# Patient Record
Sex: Male | Born: 1965 | Race: White | Hispanic: No | Marital: Single | Smoking: Former smoker
Health system: Southern US, Community
[De-identification: ages and names within clinical notes are randomized; demographics above are authoritative.]

## PROBLEM LIST (undated history)

## (undated) DIAGNOSIS — N419 Inflammatory disease of prostate, unspecified: Secondary | ICD-10-CM

## (undated) HISTORY — PX: TONSILLECTOMY: SUR1361

## (undated) HISTORY — PX: ABDOMINAL SURGERY: SHX537

---

## 2017-01-04 ENCOUNTER — Emergency Department (HOSPITAL_BASED_OUTPATIENT_CLINIC_OR_DEPARTMENT_OTHER)
Admission: EM | Admit: 2017-01-04 | Discharge: 2017-01-04 | Disposition: A | Discharge status: Home / Self Care | Payer: Managed Care, Other (non HMO) | Attending: Emergency Medicine | Admitting: Emergency Medicine

## 2017-01-04 ENCOUNTER — Encounter (HOSPITAL_BASED_OUTPATIENT_CLINIC_OR_DEPARTMENT_OTHER): Payer: Self-pay

## 2017-01-04 ENCOUNTER — Emergency Department (HOSPITAL_BASED_OUTPATIENT_CLINIC_OR_DEPARTMENT_OTHER): Payer: Managed Care, Other (non HMO)

## 2017-01-04 DIAGNOSIS — N411 Chronic prostatitis: Secondary | ICD-10-CM | POA: Diagnosis not present

## 2017-01-04 DIAGNOSIS — Z87891 Personal history of nicotine dependence: Secondary | ICD-10-CM | POA: Insufficient documentation

## 2017-01-04 DIAGNOSIS — R339 Retention of urine, unspecified: Secondary | ICD-10-CM | POA: Diagnosis present

## 2017-01-04 DIAGNOSIS — K5900 Constipation, unspecified: Secondary | ICD-10-CM | POA: Insufficient documentation

## 2017-01-04 HISTORY — DX: Inflammatory disease of prostate, unspecified: N41.9

## 2017-01-04 LAB — URINALYSIS, MICROSCOPIC (REFLEX)

## 2017-01-04 LAB — URINALYSIS, ROUTINE W REFLEX MICROSCOPIC
BILIRUBIN URINE: NEGATIVE
GLUCOSE, UA: NEGATIVE mg/dL
Ketones, ur: NEGATIVE mg/dL
Leukocytes, UA: NEGATIVE
Nitrite: NEGATIVE
Protein, ur: NEGATIVE mg/dL
SPECIFIC GRAVITY, URINE: 1.01 (ref 1.005–1.030)
pH: 6 (ref 5.0–8.0)

## 2017-01-04 MED ORDER — POLYETHYLENE GLYCOL 3350 17 G PO PACK: 17.0000 g | PACK | Freq: Every day | ORAL | 0 refills | Status: AC

## 2017-01-04 MED ORDER — SENNOSIDES-DOCUSATE SODIUM 8.6-50 MG PO TABS: 1.0000 | ORAL_TABLET | Freq: Every day | ORAL | 0 refills | Status: AC

## 2017-01-04 MED ORDER — FLEET ENEMA 7-19 GM/118ML RE ENEM: 1.0000 | ENEMA | Freq: Every day | RECTAL | 0 refills | Status: AC | PRN

## 2017-01-04 NOTE — Discharge Instructions (Signed)
You were seen in the ED today with urine retention and constipation. We are going to keep the foley catheter in place. This will need to be removed by either your PCP or the Urologist in the coming week. Call to schedule an appointment. Take the medication for constipation to help relieve symptoms.

## 2017-01-04 NOTE — ED Notes (Signed)
Patient stated that he had this episode 10 years ago and was diagnosed with prostitis.   Foley cath was inserted and he felt better during that time.  He also stated that he felt like he is constipated but unable to defecate.Kurt Finley

## 2017-01-04 NOTE — ED Provider Notes (Signed)
Emergency Department Provider Note   I have reviewed the triage vital signs and the nursing notes.   HISTORY  Chief Complaint Urinary Retention   HPI Kurt Finley is a 51 y.o. male with PMH of prostatitis presents to the emergency department for evaluation of sudden onset urinary hesitancy and constipation. The patient had prior similar episode 10 years ago when he was diagnosed with prostatitis. He does note some pain when trying to defecate. Denies noticing any blood in the stool or masses noted rectum. No dysuria, has since, urgency. No fevers or chills. No associated vomiting but he does have nausea when extreme pain. No urethral discharge.    Past Medical History:  Diagnosis Date  . Prostatitis     There are no active problems to display for this patient.   Past Surgical History:  Procedure Laterality Date  . ABDOMINAL SURGERY    . TONSILLECTOMY        Allergies Soy allergy  No family history on file.  Social History Social History  Substance Use Topics  . Smoking status: Former Games developer  . Smokeless tobacco: Never Used  . Alcohol use No    Review of Systems  Constitutional: No fever/chills Eyes: No visual changes. ENT: No sore throat. Cardiovascular: Denies chest pain. Respiratory: Denies shortness of breath. Gastrointestinal: No abdominal pain.  No nausea, no vomiting.  No diarrhea.  Positive constipation. Genitourinary: Negative for dysuria. Urine retention.  Musculoskeletal: Negative for back pain. Skin: Negative for rash. Neurological: Negative for headaches, focal weakness or numbness.  10-point ROS otherwise negative.  ____________________________________________   PHYSICAL EXAM:  VITAL SIGNS: ED Triage Vitals  Enc Vitals Group     BP 01/04/17 1400 (!) 133/105     Pulse Rate 01/04/17 1400 (!) 114     Resp 01/04/17 1400 (!) 22     Temp 01/04/17 1400 98.2 F (36.8 C)     Temp Source 01/04/17 1400 Oral     SpO2 01/04/17 1400 100 %     Weight 01/04/17 1401 239 lb (108.4 kg)     Height 01/04/17 1401  (1.88 m)     Pain Score 01/04/17 1359 2   Constitutional: Alert and oriented. Well appearing and in no acute distress. Eyes: Conjunctivae are normal.  Head: Atraumatic. Nose: No congestion/rhinnorhea. Mouth/Throat: Mucous membranes are moist.   Neck: No stridor.   Cardiovascular: Normal rate, regular rhythm. Good peripheral circulation. Grossly normal heart sounds.   Respiratory: Normal respiratory effort.  No retractions. Lungs CTAB. Gastrointestinal: Soft and nontender. No distention. No fissures on exam. No perirectal abscess on exam. No fecal impaction. Brown stool. No BRB or melena.  Musculoskeletal: No lower extremity tenderness nor edema. No gross deformities of extremities. Neurologic:  Normal speech and language. No gross focal neurologic deficits are appreciated.  Skin:  Skin is warm, dry and intact. No rash noted.   ____________________________________________   LABS (all labs ordered are listed, but only abnormal results are displayed)  Labs Reviewed  URINALYSIS, ROUTINE W REFLEX MICROSCOPIC - Abnormal; Notable for the following:       Result Value   Hgb urine dipstick TRACE (*)    All other components within normal limits  URINALYSIS, MICROSCOPIC (REFLEX) - Abnormal; Notable for the following:    Bacteria, UA RARE (*)    Squamous Epithelial / LPF 0-5 (*)    Non Squamous Epithelial PRESENT (*)    All other components within normal limits  URINE CULTURE   ____________________________________________  RADIOLOGY  Dg Abdomen Acute W/chest  Result Date: 01/04/2017 CLINICAL DATA:  Urge to urinate and defecate but unable to do so since early this am. EXAM: DG ABDOMEN ACUTE W/ 1V CHEST COMPARISON:  None. FINDINGS: There is no evidence of dilated bowel loops or free intraperitoneal air. No radiopaque calculi or other significant radiographic abnormality is seen. Heart size and mediastinal contours are  within normal limits. Both lungs are clear. IMPRESSION: Negative abdominal radiographs.  No acute cardiopulmonary disease. Electronically Signed   By: Elige Ko   On: 01/04/2017 15:38   Ct Renal Stone Study  Result Date: 01/04/2017 CLINICAL DATA:  Anal pain EXAM: CT ABDOMEN AND PELVIS WITHOUT CONTRAST TECHNIQUE: Multidetector CT imaging of the abdomen and pelvis was performed following the standard protocol without IV contrast. COMPARISON:  None. FINDINGS: Lower chest: No acute abnormality. Hepatobiliary: The liver and gallbladder are unremarkable. Pancreas: Unremarkable. Spleen: Unremarkable. Adrenals/Urinary Tract: Adrenal glands are within normal limits. Kidneys are within normal limits. Specifically, there is no hydronephrosis or asymmetrical perinephric stranding. No urinary calculus is identified. Foley catheter decompresses the bladder. Stomach/Bowel: Normal appendix. No obvious colonic mass. No evidence of small-bowel obstruction. Prominent stool burden in the rectum. There is stranding about the rectum in the presacral fat. There is also haziness within the central mesenteric fat. Vascular/Lymphatic: No abnormal retroperitoneal adenopathy. No evidence of aortic aneurysm. Reproductive: Prostate is normal in size. Other: No free-fluid. Musculoskeletal: No vertebral compression deformity. IMPRESSION: No evidence of urinary calculus or ureteral obstruction. Prominent stool burden in the rectum Prostate is normal in size. Foley catheter decompresses the bladder. There is haziness and stranding in the fat posterior to the rectum and within the central mesenteric. This is nonspecific. An inflammatory process is not excluded. Electronically Signed   By: Jolaine Click M.D.   On: 01/04/2017 16:58    ____________________________________________   PROCEDURES  Procedure(s) performed:   Procedures  None ____________________________________________   INITIAL IMPRESSION / ASSESSMENT AND PLAN / ED  COURSE  Pertinent labs & imaging results that were available during my care of the patient were reviewed by me and considered in my medical decision making (see chart for details).  Patient presents to the emergency department with rectal pain, constipation, urinary hesitancy. Patient has tenderness on digital rectal exam. I did not appreciate any fissures or perirectal abscess. Tenderness does seem located over the prostate. Plan for urinalysis, plain film of the abdomen to evaluate stool burden. Bedside ultrasound shows approximately 300 mL's of urine in the bladder.   Foley placed with leg bag. CT with constipation which may be causing symptoms of urine retention. Some rectal inflammation. No abscess. Plan to treat symptoms and leave foley in place with Urology/PCP follow up and void trial after constipation treatment. Discussed ED return precautions for worsening pain or infection symptoms.   At this time, I do not feel there is any life-threatening condition present. I have reviewed and discussed all results (EKG, imaging, lab, urine as appropriate), exam findings with patient. I have reviewed nursing notes and appropriate previous records.  I feel the patient is safe to be discharged home without further emergent workup. Discussed usual and customary return precautions. Patient and family (if present) verbalize understanding and are comfortable with this plan.  Patient will follow-up with their primary care provider. If they do not have a primary care provider, information for follow-up has been provided to them. All questions have been answered.  ____________________________________________  FINAL CLINICAL IMPRESSION(S) / ED DIAGNOSES  Final diagnoses:  Urinary retention  Constipation, unspecified constipation type     MEDICATIONS GIVEN DURING THIS VISIT:  Medications - No data to display   NEW OUTPATIENT MEDICATIONS STARTED DURING THIS VISIT:  Discharge Medication List as of  01/04/2017  5:26 PM    START taking these medications   Details  polyethylene glycol (MIRALAX) packet Take 17 g by mouth daily., Starting Wed 01/04/2017, Print    senna-docusate (SENOKOT-S) 8.6-50 MG tablet Take 1 tablet by mouth daily., Starting Wed 01/04/2017, Until Wed 01/11/2017, Print    sodium phosphate (FLEET) 7-19 GM/118ML ENEM Place 133 mLs (1 enema total) rectally daily as needed for severe constipation., Starting Wed 01/04/2017, Until Wed 01/11/2017, Print        Note:  This document was prepared using Dragon voice recognition software and may include unintentional dictation errors.  Alona Bene, MD Emergency Medicine    Long, Arlyss Repress, MD 01/05/17 [phone]

## 2017-01-04 NOTE — ED Triage Notes (Signed)
Pt states he is having the urge to void and defecate with no results x today-NAD-steady gait

## 2017-01-05 LAB — URINE CULTURE
CULTURE: NO GROWTH
SPECIAL REQUESTS: NORMAL

## 2019-04-28 IMAGING — CT CT RENAL STONE PROTOCOL
2 of 4 series · 16 of 46 positions shown, 18 images · non-contrast
Comparison: None.

CLINICAL DATA: Anal pain

EXAM:
CT ABDOMEN AND PELVIS WITHOUT CONTRAST
TECHNIQUE: Multidetector CT imaging of the abdomen and pelvis was performed
following the standard protocol without IV contrast.

[Series 2: axial st · axial · 0.98mm/px · z∈[-662,-212]mm · 13 of 100 slices shown, 15 images]
[im 5/100  soft-tissue]
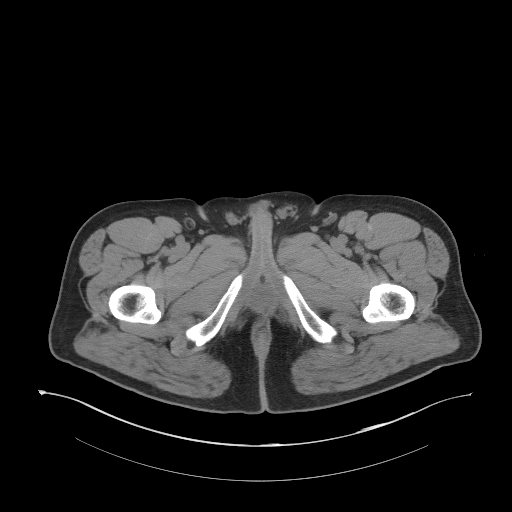
[im 5/100  bone]
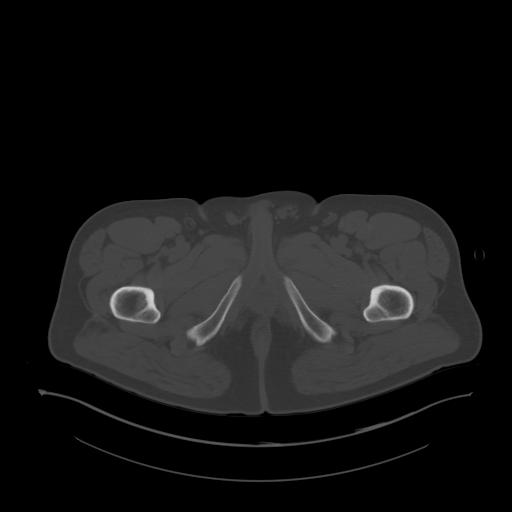
[im 13/100  soft-tissue]
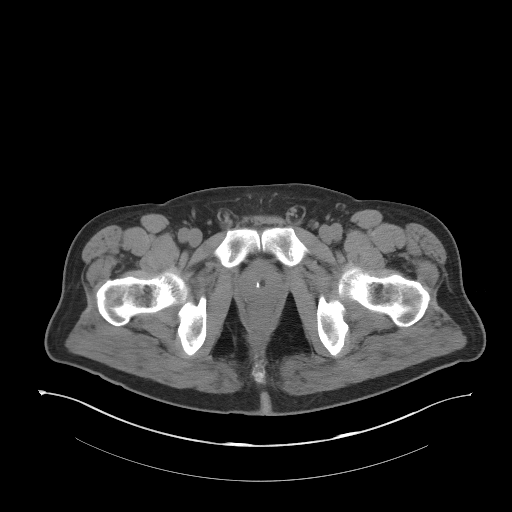
[im 21/100  soft-tissue]
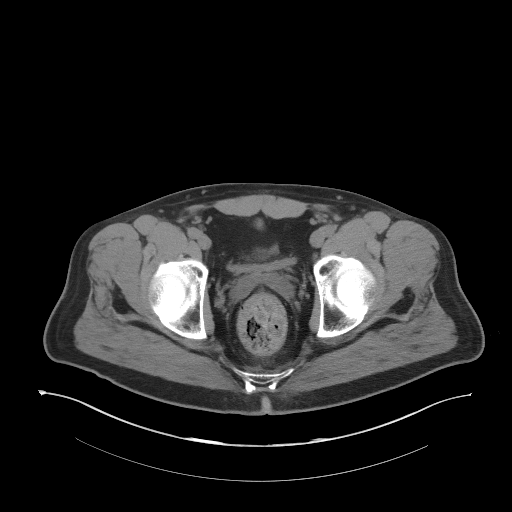
[im 29/100  soft-tissue]
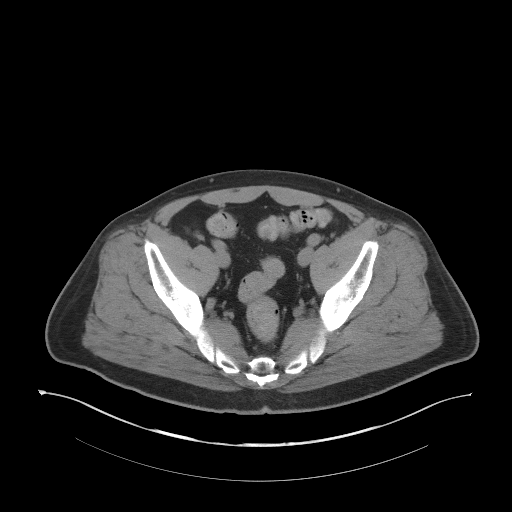
[im 34/100  soft-tissue]
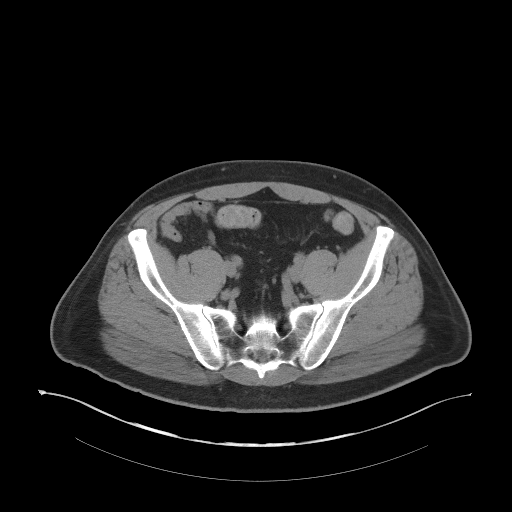
[im 42/100  soft-tissue]
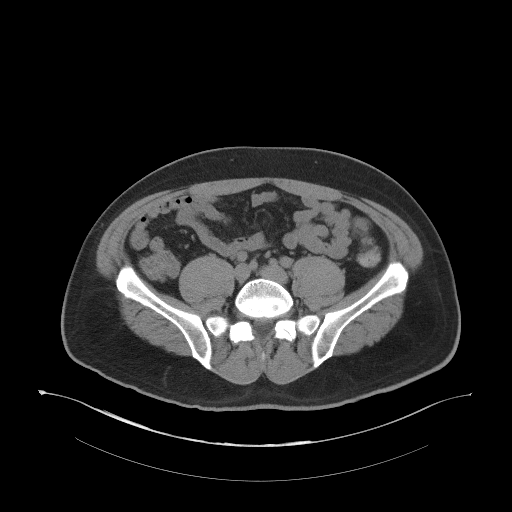
[im 50/100  soft-tissue]
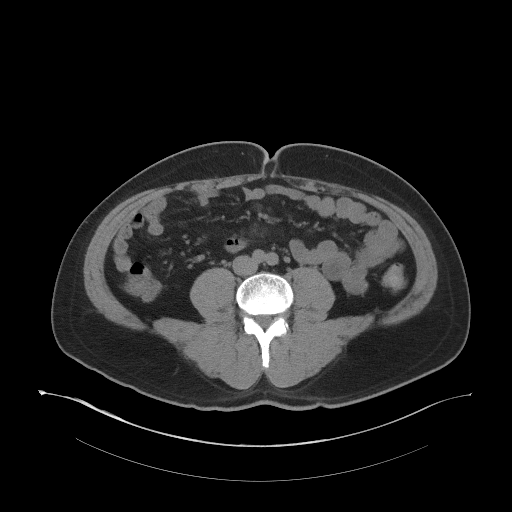
[im 58/100  soft-tissue]
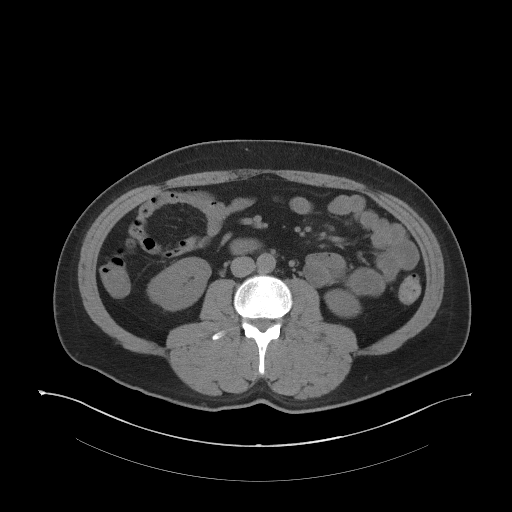
[im 67/100  soft-tissue]
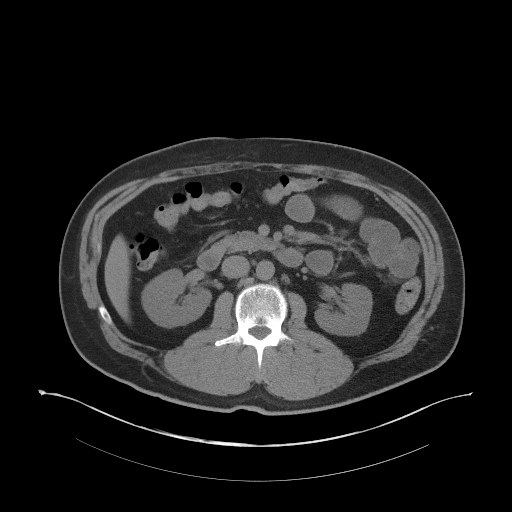
[im 67/100  bone]
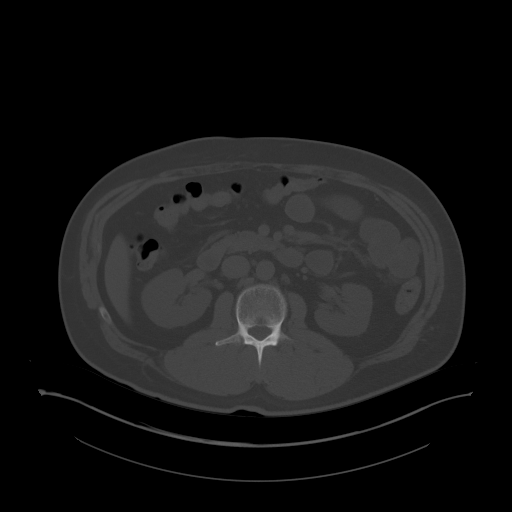
[im 71/100  soft-tissue]
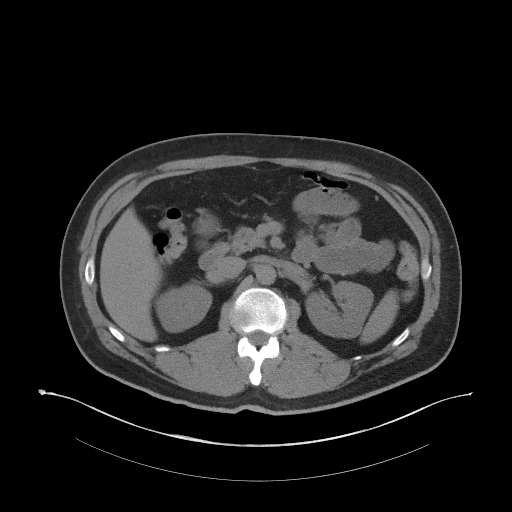
[im 79/100  soft-tissue]
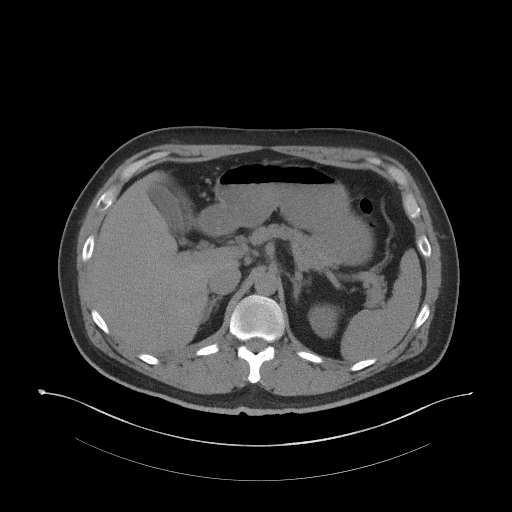
[im 87/100  soft-tissue]
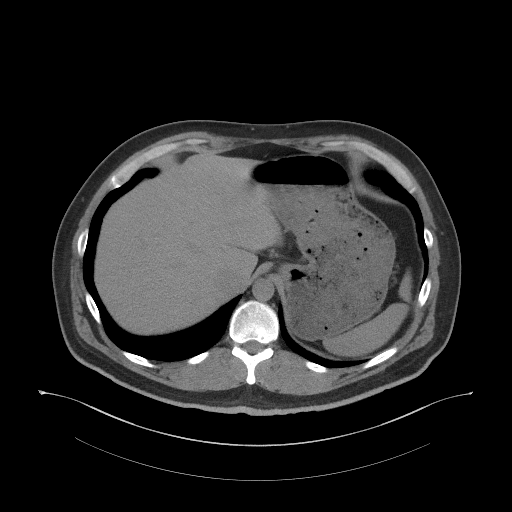
[im 95/100  soft-tissue]
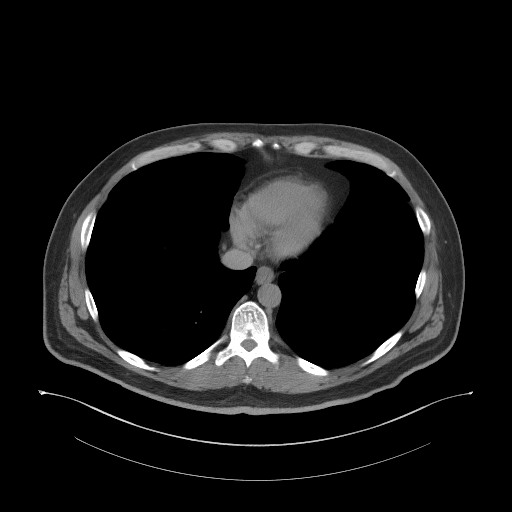

[Series 5: coronal st · coronal · 0.84mm/px · 3 of 101 slices shown]
[im 34/101  soft-tissue]
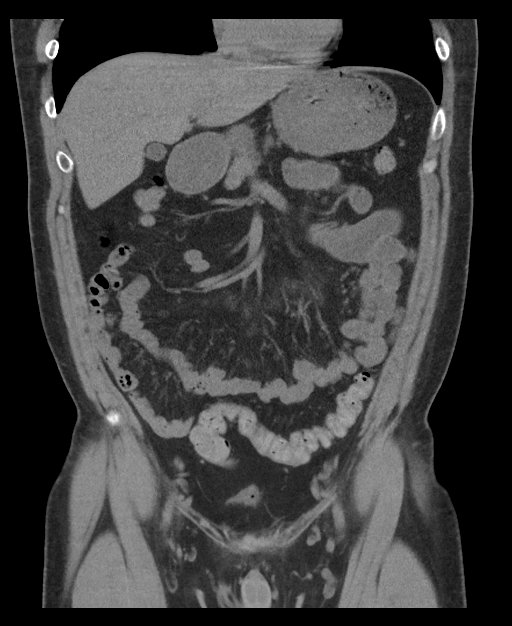
[im 45/101  soft-tissue]
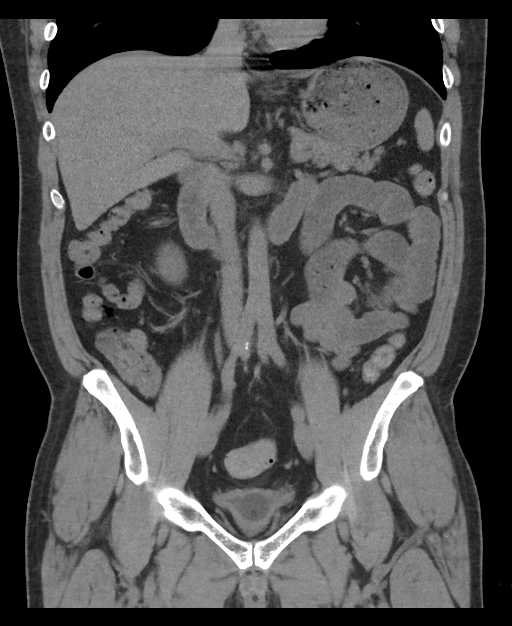
[im 56/101  soft-tissue]
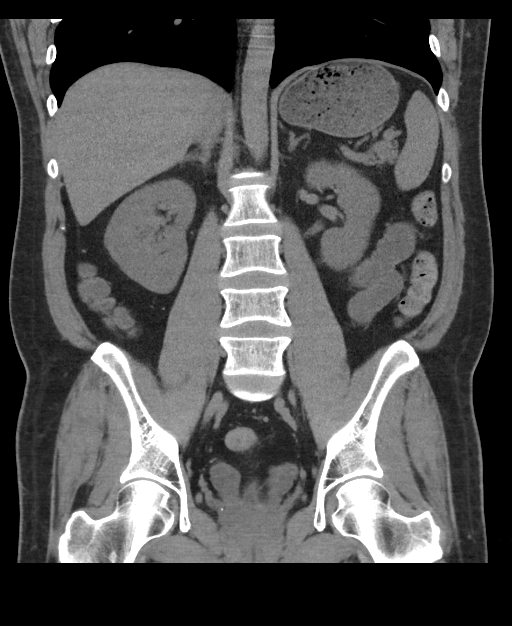

[16 of 46 positions shown; findings below may reference images not displayed]

FINDINGS: Lower chest: No acute abnormality.

Hepatobiliary: The liver and gallbladder are unremarkable.

Pancreas: Unremarkable.

Spleen: Unremarkable.

Adrenals/Urinary Tract: Adrenal glands are within normal limits.
Kidneys are within normal limits. Specifically, there is no
hydronephrosis or asymmetrical perinephric stranding. No urinary
calculus is identified. Foley catheter decompresses the bladder.

Stomach/Bowel: Normal appendix. No obvious colonic mass. No evidence
of small-bowel obstruction. Prominent stool burden in the rectum.
There is stranding about the rectum in the presacral fat. There is
also haziness within the central mesenteric fat.

Vascular/Lymphatic: No abnormal retroperitoneal adenopathy. No
evidence of aortic aneurysm.

Reproductive: Prostate is normal in size.

Other: No free-fluid.

Musculoskeletal: No vertebral compression deformity.
IMPRESSION: No evidence of urinary calculus or ureteral obstruction.

Prominent stool burden in the rectum

Prostate is normal in size.

Foley catheter decompresses the bladder.

There is haziness and stranding in the fat posterior to the rectum
and within the central mesenteric. This is nonspecific. An
inflammatory process is not excluded.

## 2021-05-24 LAB — COLOGUARD: COLOGUARD: NEGATIVE

## 2024-05-08 ENCOUNTER — Other Ambulatory Visit: Payer: Self-pay | Admitting: Family Medicine

## 2024-05-08 DIAGNOSIS — I2510 Atherosclerotic heart disease of native coronary artery without angina pectoris: Secondary | ICD-10-CM

## 2024-05-08 NOTE — Progress Notes (Signed)
 Mod ASCVD risk given HLD, Obesity, and h/o smoking (currently tobacco free).  CAC screening test ordered.

## 2024-05-22 ENCOUNTER — Other Ambulatory Visit (HOSPITAL_BASED_OUTPATIENT_CLINIC_OR_DEPARTMENT_OTHER)
